# Patient Record
Sex: Male | Born: 1965 | Race: Black or African American | Hispanic: No | Marital: Single | Smoking: Current every day smoker
Health system: Southern US, Community
[De-identification: ages and names within clinical notes are randomized; demographics above are authoritative.]

## PROBLEM LIST (undated history)

## (undated) HISTORY — PX: OTHER SURGICAL HISTORY: SHX169

---

## 2008-04-23 ENCOUNTER — Emergency Department (HOSPITAL_COMMUNITY): Admission: EM | Admit: 2008-04-23 | Discharge: 2008-04-24 | Payer: Self-pay | Admitting: Emergency Medicine

## 2009-09-16 IMAGING — CR DG CHEST 2V
2 series · 2 of 2 positions shown · non-contrast
Comparison: None

CLINICAL DATA: Fever, headache, productive cough

CHEST - 2 VIEW

[w chest pa]
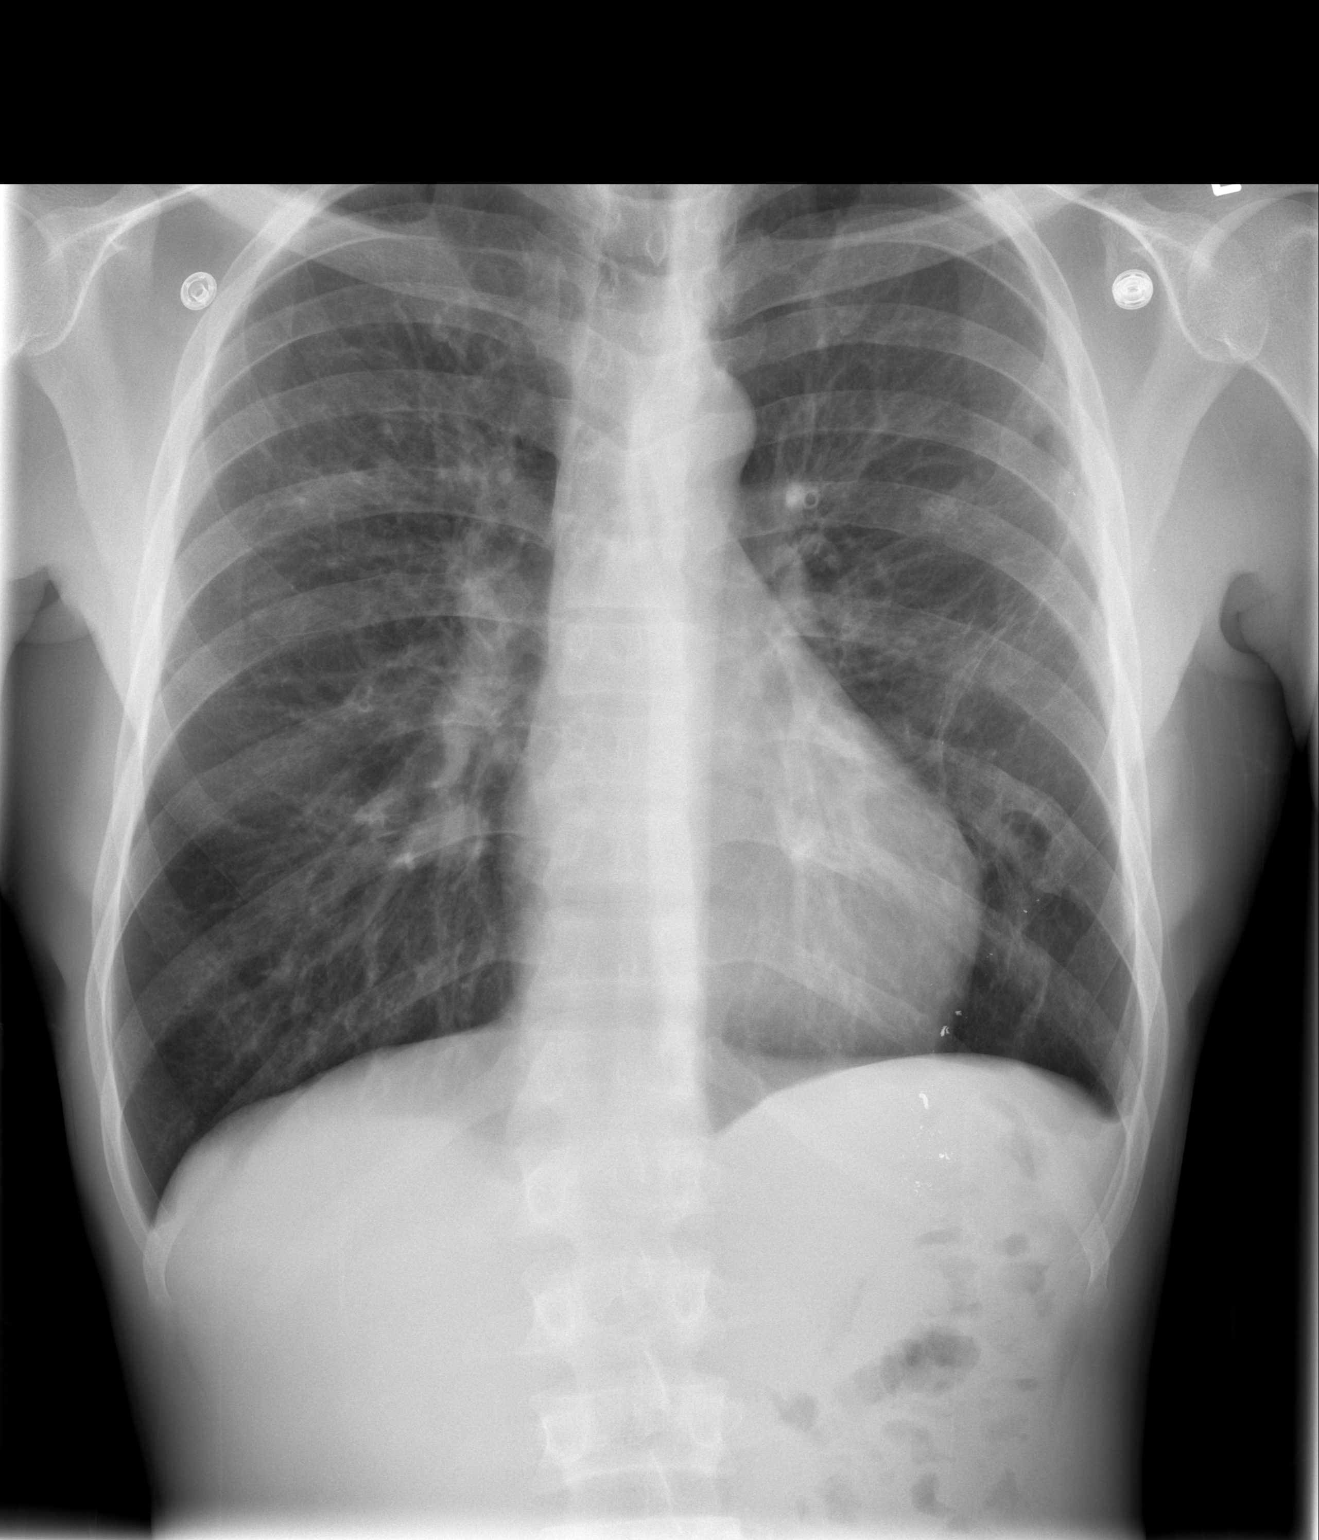

[w chest lat]
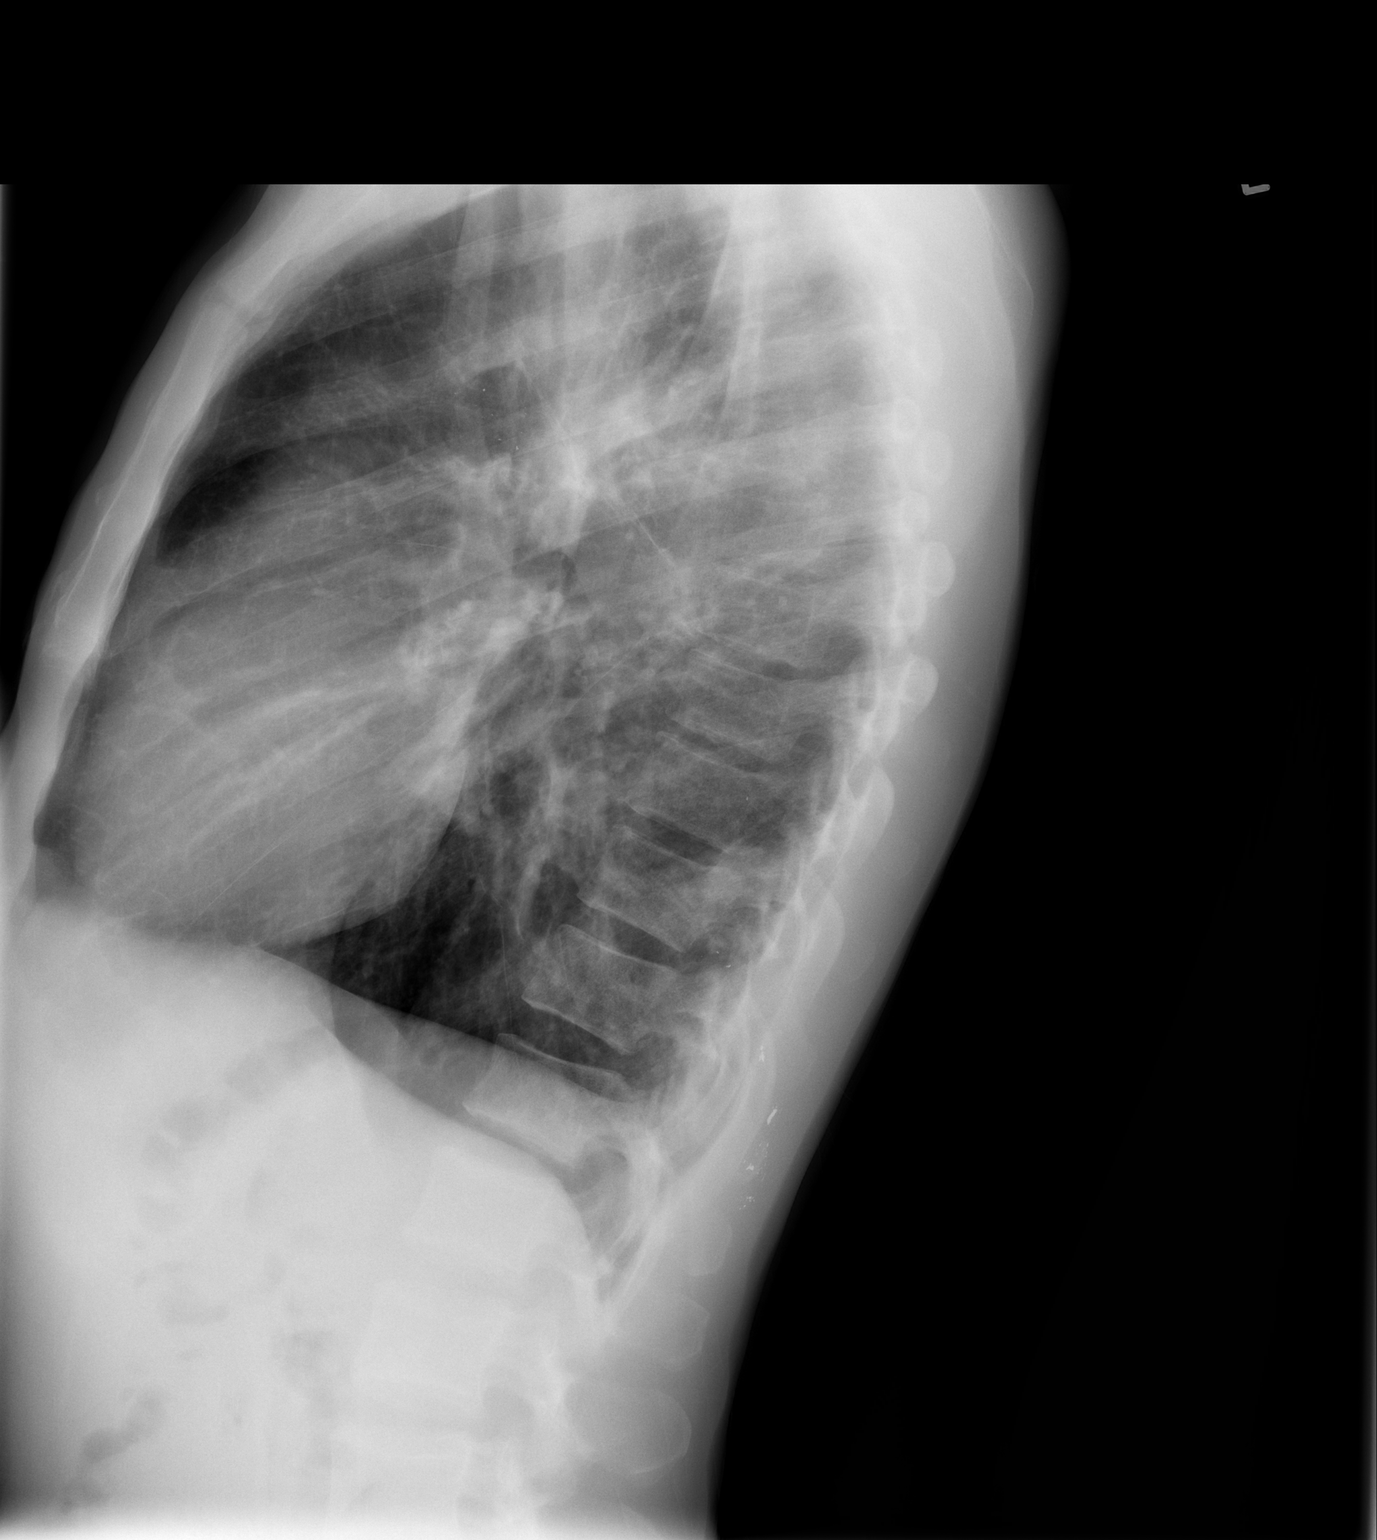

[2 of 2 positions shown; findings below may reference images not displayed]

FINDINGS: Radiopacities overlie the posterior left hemithoracic
soft tissues.  Heart size is normal.  Left mid lung zone scarring
noted.  Central bronchial wall thickening noted with streaky
perihilar opacities.  Remote left posterior rib deformities are
present.  No acute bony finding.
IMPRESSION: Bronchial wall thickening and streaky perihilar opacities which may
suggest bronchitis and/or reactive airways disease.  No focal acute
pulmonary opacity.

## 2011-05-10 LAB — CBC
Hemoglobin: 12.9 — ABNORMAL LOW
MCHC: 33.1
MCV: 83.7
RDW: 14.1

## 2011-05-10 LAB — DIFFERENTIAL
Eosinophils Absolute: 0
Lymphocytes Relative: 28
Lymphs Abs: 1.1
Monocytes Relative: 17 — ABNORMAL HIGH
Neutrophils Relative %: 55

## 2011-05-10 LAB — COMPREHENSIVE METABOLIC PANEL
ALT: 33
Calcium: 9
Creatinine, Ser: 0.79
GFR calc Af Amer: >60
Glucose, Bld: 110 — ABNORMAL HIGH
Sodium: 135
Total Protein: 6.5

## 2012-10-16 ENCOUNTER — Emergency Department (HOSPITAL_COMMUNITY)
Admission: EM | Admit: 2012-10-16 | Discharge: 2012-10-16 | Disposition: A | Discharge status: Home / Self Care | Payer: Self-pay | Attending: Emergency Medicine | Admitting: Emergency Medicine

## 2012-10-16 ENCOUNTER — Encounter (HOSPITAL_COMMUNITY): Payer: Self-pay | Admitting: *Deleted

## 2012-10-16 DIAGNOSIS — L239 Allergic contact dermatitis, unspecified cause: Secondary | ICD-10-CM

## 2012-10-16 DIAGNOSIS — L259 Unspecified contact dermatitis, unspecified cause: Secondary | ICD-10-CM | POA: Insufficient documentation

## 2012-10-16 DIAGNOSIS — R21 Rash and other nonspecific skin eruption: Secondary | ICD-10-CM | POA: Insufficient documentation

## 2012-10-16 DIAGNOSIS — F17200 Nicotine dependence, unspecified, uncomplicated: Secondary | ICD-10-CM | POA: Insufficient documentation

## 2012-10-16 DIAGNOSIS — L299 Pruritus, unspecified: Secondary | ICD-10-CM | POA: Insufficient documentation

## 2012-10-16 MED ORDER — PREDNISONE 20 MG PO TABS
60.0000 mg | ORAL_TABLET | Freq: Once | ORAL | Status: AC
  Administered 2012-10-16: 60 mg via ORAL
  Filled 2012-10-16: qty 3

## 2012-10-16 MED ORDER — DIPHENHYDRAMINE HCL 25 MG PO CAPS
50.0000 mg | ORAL_CAPSULE | Freq: Once | ORAL | Status: AC
  Administered 2012-10-16: 50 mg via ORAL
  Filled 2012-10-16: qty 2

## 2012-10-16 MED ORDER — DIPHENHYDRAMINE HCL 25 MG PO TABS: 50.0000 mg | ORAL_TABLET | Freq: Four times a day (QID) | ORAL | Status: AC

## 2012-10-16 MED ORDER — PREDNISONE 10 MG PO TABS: ORAL_TABLET | ORAL | Status: DC

## 2012-10-16 MED ORDER — PREDNISONE 20 MG PO TABS: 60.0000 mg | ORAL_TABLET | Freq: Every day | ORAL | Status: DC

## 2012-10-16 NOTE — ED Notes (Signed)
Pt is here with bilateral foot and palm redness since Saturday and has swelling
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
## 2012-10-16 NOTE — ED Notes (Signed)
Patient states he ate black eyed peas out of a can from [employer] on Friday.  Patient states when he woke up on Saturday morning he had "red places" and hit hands were itching.  Patient states it went to the bottom of his feet.   Patient denies SOB, or any other symptoms.

## 2012-10-17 LAB — RPR: RPR Ser Ql: NONREACTIVE

## 2012-10-17 NOTE — ED Provider Notes (Signed)
Medical screening examination/treatment/procedure(s) were performed by non-physician practitioner and as supervising physician I was immediately available for consultation/collaboration.   David Yelverton, MD 10/17/12 0756 

## 2017-08-20 ENCOUNTER — Emergency Department (HOSPITAL_COMMUNITY)
Admission: EM | Admit: 2017-08-20 | Discharge: 2017-08-20 | Disposition: A | Discharge status: Home / Self Care | Payer: Self-pay | Attending: Emergency Medicine | Admitting: Emergency Medicine

## 2017-08-20 ENCOUNTER — Other Ambulatory Visit: Payer: Self-pay

## 2017-08-20 ENCOUNTER — Emergency Department (HOSPITAL_COMMUNITY): Payer: Self-pay

## 2017-08-20 ENCOUNTER — Encounter (HOSPITAL_COMMUNITY): Payer: Self-pay | Admitting: Emergency Medicine

## 2017-08-20 DIAGNOSIS — F17210 Nicotine dependence, cigarettes, uncomplicated: Secondary | ICD-10-CM | POA: Insufficient documentation

## 2017-08-20 DIAGNOSIS — Z79899 Other long term (current) drug therapy: Secondary | ICD-10-CM | POA: Insufficient documentation

## 2017-08-20 DIAGNOSIS — K6289 Other specified diseases of anus and rectum: Secondary | ICD-10-CM | POA: Insufficient documentation

## 2017-08-20 DIAGNOSIS — Z880 Allergy status to penicillin: Secondary | ICD-10-CM | POA: Insufficient documentation

## 2017-08-20 LAB — COMPREHENSIVE METABOLIC PANEL
ALT: 20 U/L (ref 17–63)
AST: 23 U/L (ref 15–41)
Albumin: 4.1 g/dL (ref 3.5–5.0)
Alkaline Phosphatase: 63 U/L (ref 38–126)
Anion gap: 8 (ref 5–15)
BUN: 17 mg/dL (ref 6–20)
CO2: 25 mmol/L (ref 22–32)
Calcium: 9.2 mg/dL (ref 8.9–10.3)
Chloride: 106 mmol/L (ref 101–111)
Creatinine, Ser: 0.76 mg/dL (ref 0.61–1.24)
GFR calc Af Amer: >60 mL/min (ref >60)
GFR calc non Af Amer: >60 mL/min (ref >60)
Glucose, Bld: 100 mg/dL — ABNORMAL HIGH (ref 65–99)
Potassium: 3.7 mmol/L (ref 3.5–5.1)
Sodium: 139 mmol/L (ref 135–145)
Total Bilirubin: 1.7 mg/dL — ABNORMAL HIGH (ref 0.3–1.2)
Total Protein: 6.8 g/dL (ref 6.5–8.1)

## 2017-08-20 LAB — POC OCCULT BLOOD, ED: Fecal Occult Bld: POSITIVE — AB

## 2017-08-20 LAB — CBC
HCT: 40.3 % (ref 39.0–52.0)
Hemoglobin: 13.2 g/dL (ref 13.0–17.0)
MCH: 28.1 pg (ref 26.0–34.0)
MCHC: 32.8 g/dL (ref 30.0–36.0)
MCV: 85.7 fL (ref 78.0–100.0)
Platelets: 213 10*3/uL (ref 150–400)
RBC: 4.7 MIL/uL (ref 4.22–5.81)
RDW: 13.6 % (ref 11.5–15.5)
WBC: 4 10*3/uL (ref 4.0–10.5)

## 2017-08-20 LAB — SAMPLE TO BLOOD BANK

## 2017-08-20 MED ORDER — CIPROFLOXACIN HCL 500 MG PO TABS
500.0000 mg | ORAL_TABLET | Freq: Once | ORAL | Status: AC
  Administered 2017-08-20: 500 mg via ORAL
  Filled 2017-08-20: qty 1

## 2017-08-20 MED ORDER — IOPAMIDOL (ISOVUE-300) INJECTION 61%
INTRAVENOUS | Status: AC
  Administered 2017-08-20: 100 mL via INTRAVENOUS
  Filled 2017-08-20: qty 100

## 2017-08-20 MED ORDER — CIPROFLOXACIN HCL 500 MG PO TABS: 500.0000 mg | ORAL_TABLET | Freq: Two times a day (BID) | ORAL | 0 refills | Status: AC

## 2017-08-20 MED ORDER — HYDROCORTISONE ACETATE 25 MG RE SUPP: 25.0000 mg | Freq: Two times a day (BID) | RECTAL | 0 refills | Status: AC

## 2017-08-20 MED ORDER — METRONIDAZOLE 500 MG PO TABS
500.0000 mg | ORAL_TABLET | Freq: Once | ORAL | Status: AC
  Administered 2017-08-20: 500 mg via ORAL
  Filled 2017-08-20: qty 1

## 2017-08-20 MED ORDER — METRONIDAZOLE 500 MG PO TABS: 500.0000 mg | ORAL_TABLET | Freq: Three times a day (TID) | ORAL | 0 refills | Status: AC

## 2017-08-20 NOTE — ED Notes (Signed)
Patient transported to CT 

## 2017-08-20 NOTE — ED Notes (Signed)
ED Provider at bedside. 

## 2017-08-20 NOTE — Discharge Instructions (Addendum)
Please follow with your primary care doctor in the next 2 days for a check-up. They must obtain records for further management.  ° °Do not hesitate to return to the Emergency Department for any new, worsening or concerning symptoms.  ° °

## 2017-08-20 NOTE — ED Triage Notes (Signed)
Pt. Stated, I had blood in my stool this morning. Sometimes I have stomach pain not today.
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
## 2017-08-22 NOTE — Care Management Note (Signed)
Case Management Note  CM contacted for no ins and no pcp with follow up need.  Not[facility]  Pt can call and make an appointment himself.  CM will [facility] [name] for possible follow up. No further CM needs noted at this time.

## 2017-08-23 ENCOUNTER — Telehealth: Payer: Self-pay

## 2017-08-23 NOTE — Telephone Encounter (Signed)
Message received from Eldridge AbrahamsAngela Kritzer, RN CM requesting a hospital follow [facility]. Call placed to the patient as an appointment is available tomorrow - 08/24/17. His daughter answered the phone and took a message for him to return the call to this CM # [phone].     Update provided to Breck CoonsA. Kritzer, RN CM

## 2017-09-07 ENCOUNTER — Telehealth: Payer: Self-pay

## 2017-09-07 NOTE — Telephone Encounter (Signed)
Attempted to contact the patient to discuss [facility], depending on appointment availability.  Call placed to # [phone] and a HIPAA compliant voicemail message was left requesting a call back to # [phone] and ask for [name] or [name].  [facility] her [facility] from the ED.

## 2017-09-19 ENCOUNTER — Telehealth: Payer: Self-pay

## 2017-09-19 NOTE — Telephone Encounter (Signed)
Letter sent to patient [facility] as we have not been able to reach him to schedule a follow up appointment.

## 2017-09-30 ENCOUNTER — Telehealth: Payer: Self-pay | Admitting: General Practice

## 2017-09-30 NOTE — Telephone Encounter (Signed)
Patient called the office earlier today regarding the letter he had received from us and scheduling an appointment.   Returned patient's call to inform him that have an appointment available for hospital follow up this coming Monday. No answer. Left patient a message asking him to return my call at [phone].
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
